# Patient Record
Sex: Female | Born: 1945 | Race: White | Hispanic: No | Marital: Married | Smoking: Former smoker
Health system: Southern US, Community
[De-identification: ages and names within clinical notes are randomized; demographics above are authoritative.]

## PROBLEM LIST (undated history)

## (undated) DIAGNOSIS — I10 Essential (primary) hypertension: Secondary | ICD-10-CM

## (undated) DIAGNOSIS — C439 Malignant melanoma of skin, unspecified: Secondary | ICD-10-CM

## (undated) DIAGNOSIS — E119 Type 2 diabetes mellitus without complications: Secondary | ICD-10-CM

## (undated) HISTORY — DX: Malignant melanoma of skin, unspecified: C43.9

## (undated) HISTORY — PX: OTHER SURGICAL HISTORY: SHX169

## (undated) HISTORY — PX: COLONOSCOPY: SHX174

## (undated) HISTORY — PX: UPPER GASTROINTESTINAL ENDOSCOPY: SHX188

---

## 2001-03-28 ENCOUNTER — Encounter: Admission: RE | Admit: 2001-03-28 | Discharge: 2001-03-28 | Payer: Self-pay | Admitting: *Deleted

## 2001-03-28 ENCOUNTER — Encounter: Payer: Self-pay | Admitting: *Deleted

## 2001-08-13 ENCOUNTER — Ambulatory Visit (HOSPITAL_COMMUNITY): Admission: RE | Admit: 2001-08-13 | Discharge: 2001-08-13 | Payer: Self-pay | Admitting: Gastroenterology

## 2002-09-30 ENCOUNTER — Encounter: Admission: RE | Admit: 2002-09-30 | Discharge: 2002-12-29 | Payer: Self-pay | Admitting: Internal Medicine

## 2002-12-31 ENCOUNTER — Encounter: Admission: RE | Admit: 2002-12-31 | Discharge: 2003-03-31 | Payer: Self-pay | Admitting: Internal Medicine

## 2003-10-06 ENCOUNTER — Other Ambulatory Visit: Admission: RE | Admit: 2003-10-06 | Discharge: 2003-10-06 | Payer: Self-pay | Admitting: Internal Medicine

## 2003-12-02 ENCOUNTER — Emergency Department: Payer: Self-pay | Admitting: Emergency Medicine

## 2004-03-30 ENCOUNTER — Ambulatory Visit: Payer: Self-pay | Admitting: Internal Medicine

## 2004-04-09 ENCOUNTER — Other Ambulatory Visit: Admission: RE | Admit: 2004-04-09 | Discharge: 2004-04-09 | Payer: Self-pay | Admitting: Internal Medicine

## 2005-05-19 ENCOUNTER — Ambulatory Visit: Payer: Self-pay | Admitting: Internal Medicine

## 2006-05-30 ENCOUNTER — Ambulatory Visit: Payer: Self-pay

## 2007-05-31 ENCOUNTER — Other Ambulatory Visit: Admission: RE | Admit: 2007-05-31 | Discharge: 2007-05-31 | Payer: Self-pay | Admitting: Pediatrics

## 2007-07-19 ENCOUNTER — Ambulatory Visit: Payer: Self-pay | Admitting: Family Medicine

## 2007-12-18 ENCOUNTER — Emergency Department: Payer: Self-pay | Admitting: Emergency Medicine

## 2008-09-10 ENCOUNTER — Ambulatory Visit: Payer: Self-pay | Admitting: Family Medicine

## 2009-09-16 ENCOUNTER — Ambulatory Visit: Payer: Self-pay | Admitting: Family Medicine

## 2010-03-18 ENCOUNTER — Inpatient Hospital Stay: Payer: Self-pay | Admitting: *Deleted

## 2010-03-23 ENCOUNTER — Ambulatory Visit: Payer: Self-pay | Admitting: Urology

## 2010-04-08 ENCOUNTER — Ambulatory Visit: Payer: Self-pay | Admitting: Urology

## 2010-04-22 ENCOUNTER — Ambulatory Visit: Payer: Self-pay | Admitting: Urology

## 2010-05-24 ENCOUNTER — Ambulatory Visit: Payer: Self-pay | Admitting: Urology

## 2010-07-02 NOTE — Procedures (Signed)
[facility]  Patient:    Sarah Vaughn, Sarah Vaughn Visit Number: [id number] MRN: [id number]          Service Type: END Location: ENDO Attending Physician:  Dennison Bulla Ii Dictated by:   Verlin Grills, M.D. Proc. Date: 08/13/01 Admit Date:  08/13/2001 Discharge Date: 08/13/2001   CC:         Darius Bump, M.D.   Procedure Report  PROCEDURE:  Colonoscopy.  REFERRING PHYSICIAN:  Darius Bump, M.D.  INDICATION FOR PROCEDURE:  The patient is a 65 year old female born 1945/02/17. Ms. Kittleson oldest sister has undergone colonoscopy to remove noncancerous colon polyps. Her younger sister has also undergone colonoscopy to remove colon polyps. There is no family history of colon cancer.  I discussed with the patient the complications associated with colonoscopy and polypectomy including a 15/1000 risk of bleeding and 05/998 risk of colon perforation requiring surgical repair. The patient has signed the operative permit.  ENDOSCOPIST:  Verlin Grills, M.D.  PREMEDICATION:  Versed 10 mg, Demerol 50 mg.  ENDOSCOPE:  Olympus pediatric colonoscope.  DESCRIPTION OF PROCEDURE:  After obtaining informed consent, the patient was placed in the left lateral decubitus position. I administered intravenous Demerol and intravenous Versed to achieve conscious sedation for the procedure. The patients cardiac rhythm, oxygen saturation and blood pressure were monitored throughout the procedure and documented in the medical record.  Anal inspection was normal. Digital rectal exam was normal. The Olympus pediatric video colonoscope was introduced into the rectum and easily advanced to the cecum. Colonic preparation for the exam today was excellent.  RECTUM:  Normal.  SIGMOID COLON/DESCENDING COLON:  Normal.  SPLENIC FLEXURE:  Normal.  TRANSVERSE COLON:  Normal.  HEPATIC FLEXURE:  Normal.  ASCENDING COLON:  Normal.  CECUM/ILEOCECAL  VALVE:  Normal.  ASSESSMENT:  Normal proctocolonoscopy to the cecum. No endoscopic evidence for the presence of colorectal neoplasia. Dictated by:   Verlin Grills, M.D. Attending Physician:  Dennison Bulla Ii DD:  08/13/01 TD:  08/15/01 Job: [id number]

## 2010-09-08 ENCOUNTER — Other Ambulatory Visit (HOSPITAL_COMMUNITY)
Admission: RE | Admit: 2010-09-08 | Discharge: 2010-09-08 | Disposition: A | Discharge status: Home / Self Care | Payer: BC Managed Care – PPO | Source: Ambulatory Visit | Attending: Family Medicine | Admitting: Family Medicine

## 2010-09-08 ENCOUNTER — Other Ambulatory Visit: Payer: Self-pay | Admitting: Physician Assistant

## 2010-09-08 DIAGNOSIS — Z01419 Encounter for gynecological examination (general) (routine) without abnormal findings: Secondary | ICD-10-CM | POA: Insufficient documentation

## 2010-10-13 ENCOUNTER — Ambulatory Visit: Payer: Self-pay | Admitting: Family Medicine

## 2010-11-23 ENCOUNTER — Ambulatory Visit: Payer: Self-pay | Admitting: Urology

## 2010-12-02 ENCOUNTER — Observation Stay: Payer: Self-pay | Admitting: Internal Medicine

## 2011-03-23 ENCOUNTER — Ambulatory Visit: Payer: Self-pay | Admitting: Gastroenterology

## 2011-03-25 LAB — PATHOLOGY REPORT

## 2011-04-25 ENCOUNTER — Ambulatory Visit: Payer: Self-pay | Admitting: Family Medicine

## 2011-10-14 ENCOUNTER — Ambulatory Visit: Payer: Self-pay | Admitting: Internal Medicine

## 2011-10-25 ENCOUNTER — Ambulatory Visit: Payer: Self-pay | Admitting: Internal Medicine

## 2011-12-23 ENCOUNTER — Ambulatory Visit: Payer: Self-pay | Admitting: Urology

## 2012-10-25 ENCOUNTER — Ambulatory Visit: Payer: Self-pay | Admitting: Internal Medicine

## 2012-11-29 IMAGING — CR DG ABDOMEN 1V
1 series · 2 of 2 positions shown · non-contrast
Comparison: none

REASON FOR EXAM: NEPHROLITHIASIS
COMMENTS:

PROCEDURE:     DXR - DXR KIDNEY URETER BLADDER  - November 23, 2010 [DATE]
RESULT:     Comparison: 04/22/2010

[Series 1: view not recorded · 0.17mm/px · 2 of 2 slices shown]
[im 1/2]
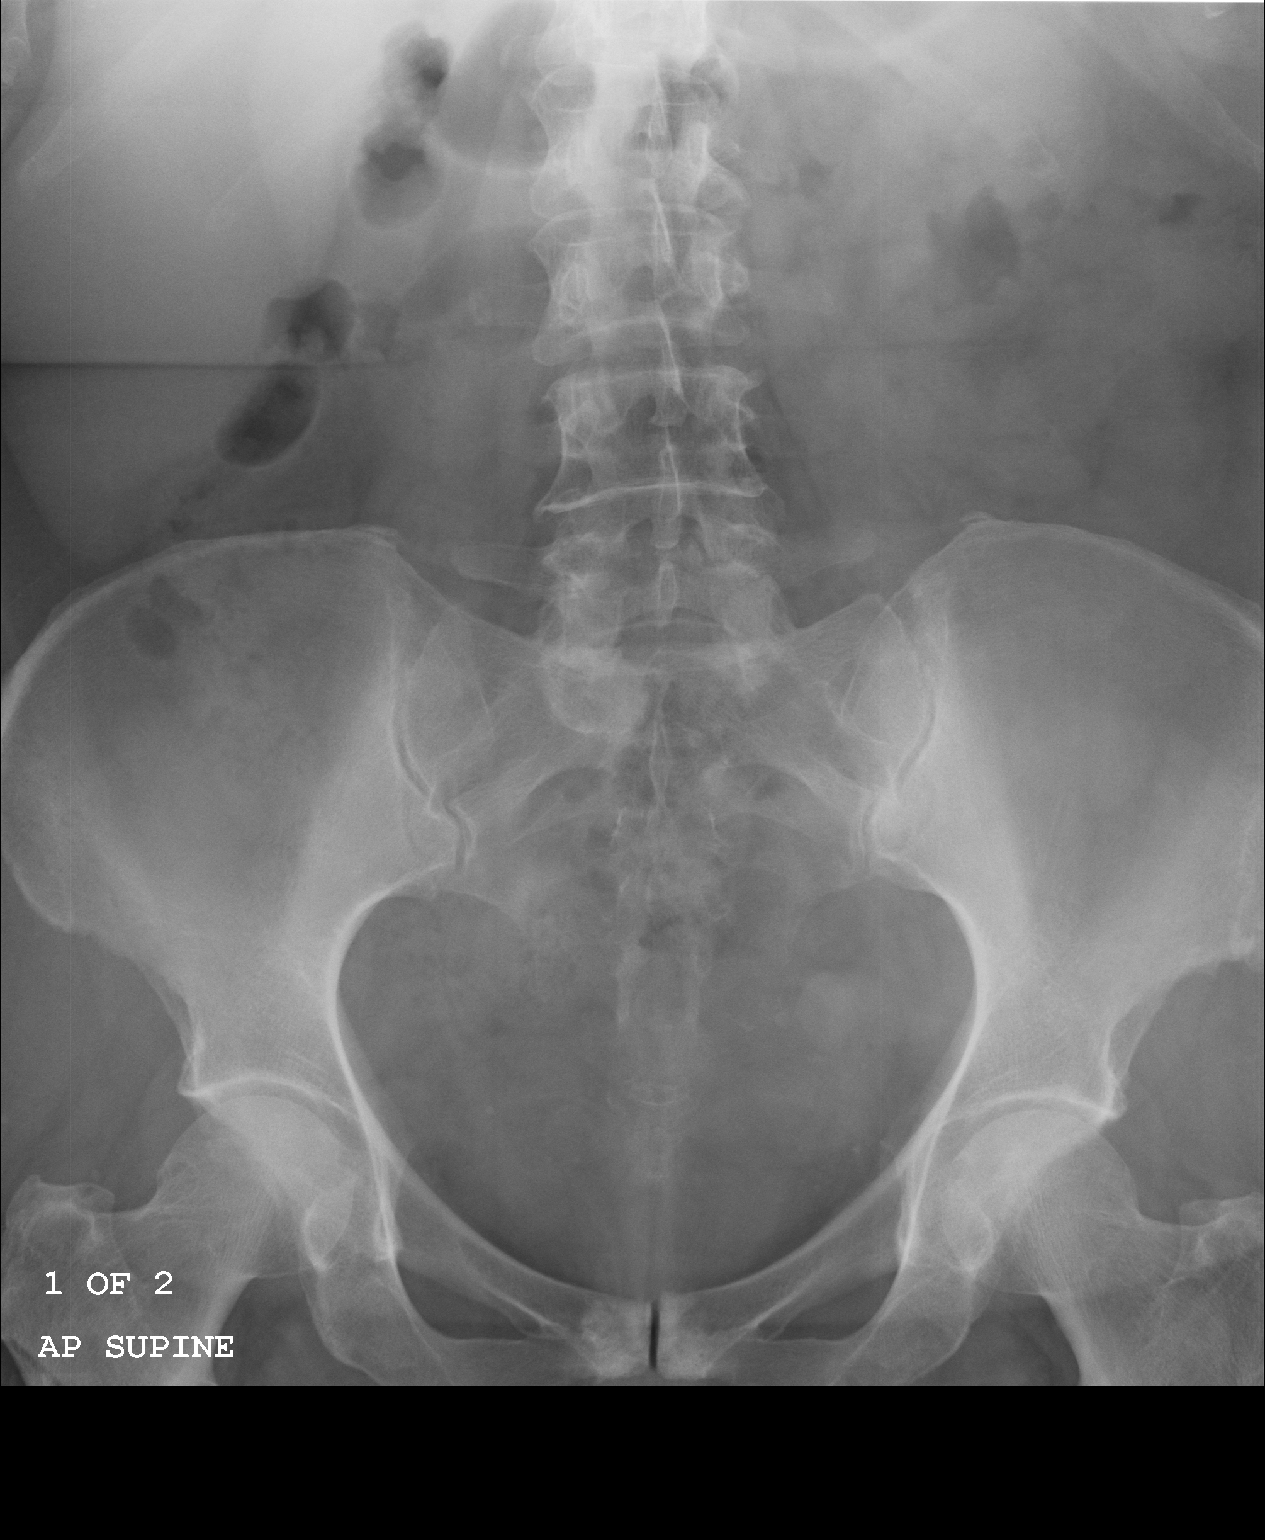
[im 2/2]
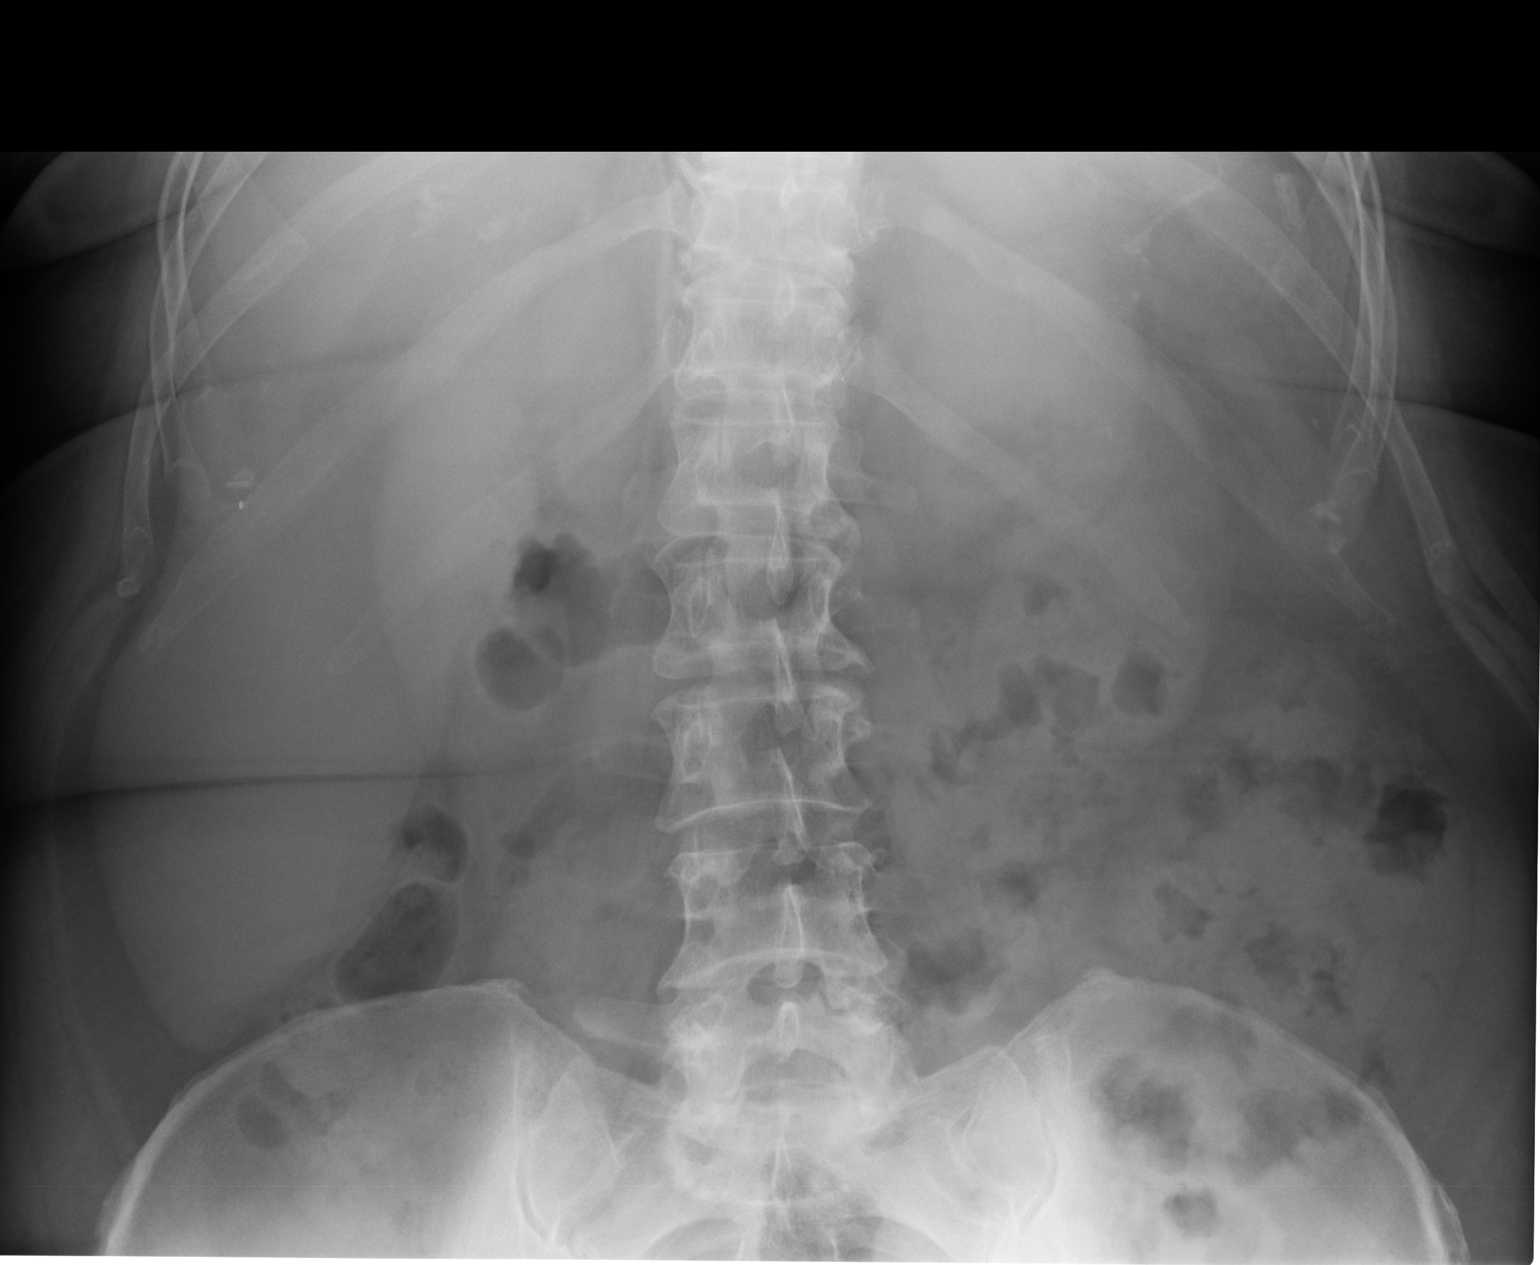

[2 of 2 positions shown; findings below may reference images not displayed]

FINDINGS: The right ureteral stent has been removed. There are small calcific
densities overlying the left renal shadow, which likely represent renal
calculi. The largest measures approximately 4 mm. Calcifications in the
pelvis likely represent phleboliths. Mild degenerative changes are seen the
pubic symphysis.
IMPRESSION: Left-sided nephrolithiasis.

## 2012-12-24 ENCOUNTER — Ambulatory Visit: Payer: Self-pay | Admitting: Urology

## 2013-10-01 ENCOUNTER — Ambulatory Visit: Payer: Self-pay | Admitting: Ophthalmology

## 2013-11-08 ENCOUNTER — Ambulatory Visit: Payer: Self-pay | Admitting: Internal Medicine

## 2014-11-12 ENCOUNTER — Other Ambulatory Visit: Payer: Self-pay | Admitting: Internal Medicine

## 2014-11-12 DIAGNOSIS — Z1231 Encounter for screening mammogram for malignant neoplasm of breast: Secondary | ICD-10-CM

## 2014-11-18 ENCOUNTER — Ambulatory Visit
Admission: RE | Admit: 2014-11-18 | Discharge: 2014-11-18 | Disposition: A | Discharge status: Home / Self Care | Payer: Medicare PPO | Source: Ambulatory Visit | Attending: Internal Medicine | Admitting: Internal Medicine

## 2014-11-18 ENCOUNTER — Other Ambulatory Visit: Payer: Self-pay | Admitting: Internal Medicine

## 2014-11-18 DIAGNOSIS — Z1231 Encounter for screening mammogram for malignant neoplasm of breast: Secondary | ICD-10-CM | POA: Diagnosis present

## 2016-01-04 ENCOUNTER — Other Ambulatory Visit: Payer: Self-pay | Admitting: Internal Medicine

## 2016-01-04 ENCOUNTER — Ambulatory Visit
Admission: RE | Admit: 2016-01-04 | Discharge: 2016-01-04 | Disposition: A | Discharge status: Home / Self Care | Payer: BC Managed Care – PPO | Source: Ambulatory Visit | Attending: Internal Medicine | Admitting: Internal Medicine

## 2016-01-04 DIAGNOSIS — Z1231 Encounter for screening mammogram for malignant neoplasm of breast: Secondary | ICD-10-CM

## 2017-02-09 ENCOUNTER — Other Ambulatory Visit: Payer: Self-pay | Admitting: Internal Medicine

## 2017-02-09 DIAGNOSIS — Z1231 Encounter for screening mammogram for malignant neoplasm of breast: Secondary | ICD-10-CM

## 2017-03-02 ENCOUNTER — Ambulatory Visit
Admission: RE | Admit: 2017-03-02 | Discharge: 2017-03-02 | Disposition: A | Discharge status: Home / Self Care | Payer: Medicare PPO | Source: Ambulatory Visit | Attending: Internal Medicine | Admitting: Internal Medicine

## 2017-03-02 DIAGNOSIS — Z1231 Encounter for screening mammogram for malignant neoplasm of breast: Secondary | ICD-10-CM | POA: Diagnosis not present

## 2017-03-21 ENCOUNTER — Ambulatory Visit: Payer: Medicare PPO | Admitting: Anesthesiology

## 2017-03-21 ENCOUNTER — Ambulatory Visit
Admission: RE | Admit: 2017-03-21 | Discharge: 2017-03-21 | Disposition: A | Discharge status: Home / Self Care | Payer: Medicare PPO | Source: Ambulatory Visit | Attending: Gastroenterology | Admitting: Gastroenterology

## 2017-03-21 ENCOUNTER — Encounter
Admission: RE | Disposition: A | Discharge status: Home / Self Care | Payer: Self-pay | Source: Ambulatory Visit | Attending: Gastroenterology

## 2017-03-21 ENCOUNTER — Encounter: Payer: Self-pay | Admitting: *Deleted

## 2017-03-21 DIAGNOSIS — Z79899 Other long term (current) drug therapy: Secondary | ICD-10-CM | POA: Insufficient documentation

## 2017-03-21 DIAGNOSIS — Z8371 Family history of colonic polyps: Secondary | ICD-10-CM | POA: Diagnosis not present

## 2017-03-21 DIAGNOSIS — K648 Other hemorrhoids: Secondary | ICD-10-CM | POA: Diagnosis not present

## 2017-03-21 DIAGNOSIS — Z7982 Long term (current) use of aspirin: Secondary | ICD-10-CM | POA: Insufficient documentation

## 2017-03-21 DIAGNOSIS — Z87891 Personal history of nicotine dependence: Secondary | ICD-10-CM | POA: Insufficient documentation

## 2017-03-21 DIAGNOSIS — Z7984 Long term (current) use of oral hypoglycemic drugs: Secondary | ICD-10-CM | POA: Insufficient documentation

## 2017-03-21 DIAGNOSIS — I10 Essential (primary) hypertension: Secondary | ICD-10-CM | POA: Insufficient documentation

## 2017-03-21 DIAGNOSIS — D123 Benign neoplasm of transverse colon: Secondary | ICD-10-CM | POA: Diagnosis not present

## 2017-03-21 DIAGNOSIS — E119 Type 2 diabetes mellitus without complications: Secondary | ICD-10-CM | POA: Insufficient documentation

## 2017-03-21 DIAGNOSIS — Z1211 Encounter for screening for malignant neoplasm of colon: Secondary | ICD-10-CM | POA: Insufficient documentation

## 2017-03-21 DIAGNOSIS — K644 Residual hemorrhoidal skin tags: Secondary | ICD-10-CM | POA: Diagnosis not present

## 2017-03-21 HISTORY — DX: Essential (primary) hypertension: I10

## 2017-03-21 HISTORY — DX: Type 2 diabetes mellitus without complications: E11.9

## 2017-03-21 HISTORY — PX: COLONOSCOPY WITH PROPOFOL: SHX5780

## 2017-03-21 LAB — GLUCOSE, CAPILLARY: Glucose-Capillary: 253 mg/dL — ABNORMAL HIGH (ref 65–99)

## 2017-03-21 SURGERY — COLONOSCOPY WITH PROPOFOL
Anesthesia: General

## 2017-03-21 MED ORDER — PROPOFOL 500 MG/50ML IV EMUL
INTRAVENOUS | Status: DC | PRN
  Administered 2017-03-21: 130 ug/kg/min via INTRAVENOUS

## 2017-03-21 MED ORDER — PROPOFOL 10 MG/ML IV BOLUS
INTRAVENOUS | Status: DC | PRN
  Administered 2017-03-21: 90 mg via INTRAVENOUS

## 2017-03-21 MED ORDER — LIDOCAINE HCL (PF) 2 % IJ SOLN
INTRAMUSCULAR | Status: AC
  Filled 2017-03-21: qty 10

## 2017-03-21 MED ORDER — SODIUM CHLORIDE 0.9 % IV SOLN
INTRAVENOUS | Status: DC
  Administered 2017-03-21: 1000 mL via INTRAVENOUS

## 2017-03-21 MED ORDER — LIDOCAINE HCL (CARDIAC) 20 MG/ML IV SOLN
INTRAVENOUS | Status: DC | PRN
  Administered 2017-03-21: 50 mg via INTRAVENOUS

## 2017-03-21 MED ORDER — LIDOCAINE HCL (PF) 1 % IJ SOLN
INTRAMUSCULAR | Status: AC
  Administered 2017-03-21: 0.3 mL via INTRADERMAL
  Filled 2017-03-21: qty 2

## 2017-03-21 MED ORDER — PROPOFOL 10 MG/ML IV BOLUS
INTRAVENOUS | Status: AC
  Filled 2017-03-21: qty 20

## 2017-03-21 MED ORDER — PROPOFOL 500 MG/50ML IV EMUL
INTRAVENOUS | Status: AC
  Filled 2017-03-21: qty 50

## 2017-03-21 MED ORDER — SODIUM CHLORIDE 0.9 % IV SOLN: INTRAVENOUS | Status: DC

## 2017-03-21 MED ORDER — LIDOCAINE HCL (PF) 1 % IJ SOLN
2.0000 mL | Freq: Once | INTRAMUSCULAR | Status: AC
  Administered 2017-03-21: 0.3 mL via INTRADERMAL

## 2017-03-21 NOTE — Op Note (Signed)
[facility] Gastroenterology Patient Name: Sarah Vaughn Procedure Date: 03/21/2017 8:45 AM MRN: [id number] Account #: [id number] Date of Birth: [birth year] Admit Type: Outpatient Age: 72 Room: [facility] ENDO ROOM 3 Gender: Female Note Status: Finalized Procedure:            Colonoscopy Indications:          Family history of colonic polyps in a first-degree                        relative Providers:            Lollie Sails, MD Referring MD:         Rusty Aus, MD (Referring MD) Medicines:            Monitored Anesthesia Care Complications:        No immediate complications. Procedure:            Pre-Anesthesia Assessment:                       - ASA Grade Assessment: II - A patient with mild                        systemic disease.                       After obtaining informed consent, the colonoscope was                        passed under direct vision. Throughout the procedure,                        the patient's blood pressure, pulse, and oxygen                        saturations were monitored continuously. The                        Colonoscope was introduced through the anus and                        advanced to the the cecum, identified by appendiceal                        orifice and ileocecal valve. The colonoscopy was                        performed with moderate difficulty due to a tortuous                        colon. Successful completion of the procedure was aided                        by changing endoscopes. The patient tolerated the                        procedure well. The quality of the bowel preparation                        was good. Findings:      A 5 mm polyp was found in the transverse colon. The polyp was sessile.  The polyp was removed with a cold snare. Resection and retrieval were       complete.      Biopsies for histology were taken with a cold forceps from the right       colon and left colon for evaluation of  microscopic colitis.      Non-bleeding external and internal hemorrhoids were found during       retroflexion and during anoscopy. The hemorrhoids were small.      The digital rectal exam was normal.      The retroflexed view of the distal rectum and anal verge was normal and       showed no anal or rectal abnormalities. Impression:           - One 5 mm polyp in the transverse colon, removed with                        a cold snare. Resected and retrieved.                       - Non-bleeding external and internal hemorrhoids.                       - Biopsies were taken with a cold forceps from the                        right colon and left colon for evaluation of                        microscopic colitis. Recommendation:       - Discharge patient to home.                       - Await pathology results.                       - Telephone GI clinic for pathology results in 1 week. Procedure Code(s):    --- Professional ---                       [phone], Colonoscopy, flexible; with removal of tumor(s),                        polyp(s), or other lesion(s) by snare technique                       45380, 44, Colonoscopy, flexible; with biopsy, single                        or multiple Diagnosis Code(s):    --- Professional ---                       D12.3, Benign neoplasm of transverse colon (hepatic                        flexure or splenic flexure)                       K64.8, Other hemorrhoids                       Z83.71, Family history of colonic polyps CPT copyright 2016 American Medical  Association. All rights reserved. The codes documented in this report are preliminary and upon coder review may  be revised to meet current compliance requirements. Lollie Sails, MD 03/21/2017 9:31:53 AM This report has been signed electronically. Number of Addenda: 0 Note Initiated On: 03/21/2017 8:45 AM Scope Withdrawal Time: 0 hours 13 minutes 32 seconds  Total Procedure Duration: 0 hours 33  minutes 57 seconds       [facility]

## 2017-03-21 NOTE — Anesthesia Postprocedure Evaluation (Signed)
Anesthesia Post Note  Patient: Sarah Vaughn  Procedure(s) Performed: COLONOSCOPY WITH PROPOFOL (N/A )  Patient location during evaluation: Endoscopy Anesthesia Type: General Level of consciousness: awake and alert and oriented Pain management: pain level controlled Vital Signs Assessment: post-procedure vital signs reviewed and stable Respiratory status: spontaneous breathing, nonlabored ventilation and respiratory function stable Cardiovascular status: blood pressure returned to baseline and stable Postop Assessment: no signs of nausea or vomiting Anesthetic complications: no     Last Vitals:  Vitals:   03/21/17 0950 03/21/17 1000  BP: 119/76 130/65  Pulse: 78 74  Resp: 17 (!) 21  Temp:    SpO2: 97% 100%    Last Pain:  Vitals:   03/21/17 0920  TempSrc: Tympanic                 Lilja Soland

## 2017-03-21 NOTE — Transfer of Care (Signed)
Immediate Anesthesia Transfer of Care Note  Patient: Sarah Vaughn  Procedure(s) Performed: COLONOSCOPY WITH PROPOFOL (N/A )  Patient Location: PACU and Endoscopy Unit  Anesthesia Type:General  Level of Consciousness: drowsy  Airway & Oxygen Therapy: Patient Spontanous Breathing and Patient connected to nasal cannula oxygen  Post-op Assessment: Report given to RN and Post -op Vital signs reviewed and stable  Post vital signs: Reviewed and stable  Last Vitals:  Vitals:   03/21/17 0745 03/21/17 0920  BP: 140/83   Pulse: 96 (P) 87  Resp: 17 (P) 19  Temp: (!) 35.8 C (!) (P) 36.2 C  SpO2: 98% (P) 97%    Last Pain:  Vitals:   03/21/17 0920  TempSrc: (P) Tympanic         Complications: No apparent anesthesia complications

## 2017-03-21 NOTE — Anesthesia Post-op Follow-up Note (Signed)
Anesthesia QCDR form completed.        

## 2017-03-21 NOTE — Anesthesia Preprocedure Evaluation (Signed)
Anesthesia Evaluation  Patient identified by MRN, date of birth, ID band Patient awake    Reviewed: Allergy & Precautions, NPO status , Patient's Chart, lab work & pertinent test results  History of Anesthesia Complications Negative for: history of anesthetic complications  Airway Mallampati: IV  TM Distance: >3 FB Neck ROM: Full    Dental  (+) Implants   Pulmonary neg sleep apnea, neg COPD, former smoker,    breath sounds clear to auscultation- rhonchi (-) wheezing      Cardiovascular Exercise Tolerance: Good hypertension, Pt. on medications (-) CAD, (-) Past MI, (-) Cardiac Stents and (-) CABG  Rhythm:Regular Rate:Normal - Systolic murmurs and - Diastolic murmurs    Neuro/Psych negative neurological ROS  negative psych ROS   GI/Hepatic negative GI ROS, Neg liver ROS,   Endo/Other  diabetes, Oral Hypoglycemic Agents  Renal/GU Renal disease: hx of nephrolithiasis.     Musculoskeletal negative musculoskeletal ROS (+)   Abdominal (+) + obese,   Peds  Hematology negative hematology ROS (+)   Anesthesia Other Findings Past Medical History: No date: Diabetes mellitus without complication (HCC) No date: Hypertension   Reproductive/Obstetrics                             Anesthesia Physical Anesthesia Plan  ASA: II  Anesthesia Plan: General   Post-op Pain Management:    Induction: Intravenous  PONV Risk Score and Plan: 2 and Propofol infusion  Airway Management Planned: Natural Airway  Additional Equipment:   Intra-op Plan:   Post-operative Plan:   Informed Consent: I have reviewed the patients History and Physical, chart, labs and discussed the procedure including the risks, benefits and alternatives for the proposed anesthesia with the patient or authorized representative who has indicated his/her understanding and acceptance.   Dental advisory given  Plan Discussed with:  CRNA and Anesthesiologist  Anesthesia Plan Comments:         Anesthesia Quick Evaluation

## 2017-03-21 NOTE — H&P (Signed)
Outpatient short stay form Pre-procedure 03/21/2017 8:42 AM Sarah Sails MD  Primary Physician: Dr. Emily Filbert  Reason for visit: Colonoscopy  History of present illness: Patient is a 58 female presenting today as above.  There is a family history of colon polyps and several primary relatives but she herself is never had polyps.  She tolerated her prep well.  She takes no blood thinning agents.  She takes no aspirin products with the exception of 81 mg aspirin that has been held for a day.    Current Facility-Administered Medications:  .  0.9 %  sodium chloride infusion, , Intravenous, Continuous, Sarah Sails, MD, Last Rate: 20 mL/hr at 03/21/17 0806, 1,000 mL at 03/21/17 0806 .  0.9 %  sodium chloride infusion, , Intravenous, Continuous, Sarah Sails, MD  Medications Prior to Admission  Medication Sig Dispense Refill Last Dose  . aspirin EC 81 MG tablet Take 81 mg by mouth daily.   03/19/2017  . calcium-vitamin D (OSCAL WITH D) 500-200 MG-UNIT tablet Take 1 tablet by mouth 2 (two) times daily.     Marland Kitchen Co-Enzyme Q10 100 MG CAPS Take by mouth daily.     Marland Kitchen glimepiride (AMARYL) 2 MG tablet Take 2 mg by mouth daily with breakfast.     . lisinopril (PRINIVIL,ZESTRIL) 20 MG tablet Take 20 mg by mouth daily.   03/21/2017 at 0530  . Magnesium 250 MG TABS Take by mouth daily.     . metFORMIN (GLUCOPHAGE) 500 MG tablet Take 1,000 mg by mouth 2 (two) times daily with a meal.     . Omeprazole 20 MG TBEC Take by mouth daily.     . Phentermine HCl (ADIPEX-P PO) Take 15 mg by mouth daily.        Allergies  Allergen Reactions  . Lactulose   . Mucinex [Guaifenesin Er]      Past Medical History:  Diagnosis Date  . Diabetes mellitus without complication ([place])   . Hypertension   Review of systems:      Physical Exam    Heart and lungs: Regular rate and rhythm without rub or gallop, lungs are bilaterally clear.    HEENT: Normocephalic atraumatic eyes are anicteric    Other:     Pertinant exam for procedure: Soft nontender nondistended bowel sounds positive and normoactive    Planned proceedures: Colonoscopy and indicated procedures I have discussed the risks benefits and complications of procedures to include not limited to bleeding, infection, perforation and the risk of sedation and the patient wishes to proceed.    Sarah Sails, MD Gastroenterology 03/21/2017  8:42 AM

## 2017-03-23 LAB — SURGICAL PATHOLOGY

## 2018-03-06 ENCOUNTER — Other Ambulatory Visit: Payer: Self-pay | Admitting: Internal Medicine

## 2018-03-06 DIAGNOSIS — Z1231 Encounter for screening mammogram for malignant neoplasm of breast: Secondary | ICD-10-CM

## 2018-03-23 ENCOUNTER — Ambulatory Visit
Admission: RE | Admit: 2018-03-23 | Discharge: 2018-03-23 | Disposition: A | Discharge status: Home / Self Care | Payer: 59 | Source: Ambulatory Visit | Attending: Internal Medicine | Admitting: Internal Medicine

## 2018-03-23 DIAGNOSIS — Z1231 Encounter for screening mammogram for malignant neoplasm of breast: Secondary | ICD-10-CM | POA: Insufficient documentation

## 2019-04-10 ENCOUNTER — Other Ambulatory Visit: Payer: Self-pay | Admitting: Internal Medicine

## 2019-04-10 DIAGNOSIS — Z1231 Encounter for screening mammogram for malignant neoplasm of breast: Secondary | ICD-10-CM

## 2019-05-03 ENCOUNTER — Ambulatory Visit
Admission: RE | Admit: 2019-05-03 | Discharge: 2019-05-03 | Disposition: A | Discharge status: Home / Self Care | Payer: Medicare Other | Source: Ambulatory Visit | Attending: Internal Medicine | Admitting: Internal Medicine

## 2019-05-03 DIAGNOSIS — Z1231 Encounter for screening mammogram for malignant neoplasm of breast: Secondary | ICD-10-CM

## 2020-02-04 ENCOUNTER — Ambulatory Visit (INDEPENDENT_AMBULATORY_CARE_PROVIDER_SITE_OTHER): Payer: BC Managed Care – PPO | Admitting: Dermatology

## 2020-02-04 ENCOUNTER — Other Ambulatory Visit: Payer: Self-pay

## 2020-02-04 DIAGNOSIS — L853 Xerosis cutis: Secondary | ICD-10-CM | POA: Diagnosis not present

## 2020-02-04 DIAGNOSIS — Z1283 Encounter for screening for malignant neoplasm of skin: Secondary | ICD-10-CM

## 2020-02-04 DIAGNOSIS — L905 Scar conditions and fibrosis of skin: Secondary | ICD-10-CM

## 2020-02-04 DIAGNOSIS — L578 Other skin changes due to chronic exposure to nonionizing radiation: Secondary | ICD-10-CM

## 2020-02-04 DIAGNOSIS — Z86006 Personal history of melanoma in-situ: Secondary | ICD-10-CM

## 2020-02-04 DIAGNOSIS — L650 Telogen effluvium: Secondary | ICD-10-CM

## 2020-02-04 DIAGNOSIS — L814 Other melanin hyperpigmentation: Secondary | ICD-10-CM

## 2020-02-04 DIAGNOSIS — D229 Melanocytic nevi, unspecified: Secondary | ICD-10-CM

## 2020-02-04 DIAGNOSIS — D1800 Hemangioma unspecified site: Secondary | ICD-10-CM

## 2020-02-04 DIAGNOSIS — L821 Other seborrheic keratosis: Secondary | ICD-10-CM

## 2020-02-04 NOTE — Progress Notes (Signed)
   Follow-Up Visit   Subjective  Sarah Vaughn is a 74 y.o. female who presents for the following: Annual Exam (Patient here today for TBSE. 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 Nothing new or changing patient is aware of. ).  Patient had Covid September 2021 and has noticed hair loss since being sick.   The following portions of the chart were reviewed this encounter and updated as appropriate:       Review of Systems:  No other skin or systemic complaints except as noted in HPI or Assessment and Plan.  Objective  Well appearing patient in no apparent distress; mood and affect are within normal limits.  A full examination was performed including scalp, head, eyes, ears, nose, lips, neck, chest, axillae, abdomen, back, buttocks, bilateral upper extremities, bilateral lower extremities, hands, feet, fingers, toes, fingernails, and toenails. All findings within normal limits unless otherwise noted below.  Objective  Spinal upper Back: Linear scar   Objective  Scalp: Diffuse thinning of hair, especially at crown and temples   Assessment & Plan  Scar Spinal upper Back  History of excision of fatty growth per patient years ago  Telogen effluvium Scalp  Due to past Covid infection. Discussed hair will thin, but will regrow.   Xerosis - diffuse xerotic patches - recommend gentle, hydrating skin care - gentle skin care handout given  Lentigines - Scattered tan macules - Discussed due to sun exposure - Benign, observe - Call for any changes  Seborrheic Keratoses - Stuck-on, waxy, tan-brown papules and plaques  - Discussed benign etiology and prognosis. - Observe - Call for any changes  Melanocytic Nevi - Tan-brown and/or pink-flesh-colored symmetric macules and papules right mid back - Benign appearing on exam today - Observation - Call clinic for new or changing moles - Recommend daily use of broad spectrum spf 30+  sunscreen to sun-exposed areas.   Hemangiomas - Red papules - Discussed benign nature - Observe - Call for any changes  Actinic Damage - Chronic, secondary to cumulative UV/sun exposure - diffuse scaly erythematous macules with underlying dyspigmentation - Recommend daily broad spectrum sunscreen SPF 30+ to sun-exposed areas, reapply every 2 hours as needed.  - Call for new or changing lesions.  Skin cancer screening performed today.  History of Lentigo Maligna - No evidence of recurrence today at left lower leg - Recommend regular full body skin exams - Recommend daily broad spectrum sunscreen SPF 30+ to sun-exposed areas, reapply every 2 hours as needed.  - Call if any new or changing lesions are noted between office visits   Return in about 1 year (around 02/03/2021) for TBSE.  Graciella Belton, RMA, am acting as scribe for Brendolyn Patty, MD . Documentation: I have reviewed the above documentation for accuracy and completeness, and I agree with the above.  Brendolyn Patty MD

## 2020-02-04 NOTE — Patient Instructions (Addendum)
Melanoma ABCDEs  Melanoma is the most dangerous type of skin cancer, and is the leading cause of death from skin disease.  You are more likely to develop melanoma if you:  Have light-colored skin, light-colored eyes, or red or blond hair  Spend a lot of time in the sun  Tan regularly, either outdoors or in a tanning bed  Have had blistering sunburns, especially during childhood  Have a close family member who has had a melanoma  Have atypical moles or large birthmarks  Early detection of melanoma is key since treatment is typically straightforward and cure rates are extremely high if we catch it early.   The first sign of melanoma is often a change in a mole or a new dark spot.  The ABCDE system is a way of remembering the signs of melanoma.  A for asymmetry:  The two halves do not match. B for border:  The edges of the growth are irregular. C for color:  A mixture of colors are present instead of an even brown color. D for diameter:  Melanomas are usually (but not always) greater than 61mm - the size of a pencil eraser. E for evolution:  The spot keeps changing in size, shape, and color.  Please check your skin once per month between visits. You can use a small mirror in front and a large mirror behind you to keep an eye on the back side or your body.   If you see any new or changing lesions before your next follow-up, please call to schedule a visit.  Please continue daily skin protection including broad spectrum sunscreen SPF 30+ to sun-exposed areas, reapplying every 2 hours as needed when you're outdoors.   Seborrheic Keratosis  What causes seborrheic keratoses? Seborrheic keratoses are harmless, common skin growths that first appear during adult life.  As time goes by, more growths appear.  Some people may develop a large number of them.  Seborrheic keratoses appear on both covered and uncovered body parts.  They are not caused by sunlight.  The tendency to develop seborrheic  keratoses can be inherited.  They vary in color from skin-colored to gray, brown, or even black.  They can be either smooth or have a rough, warty surface.   Seborrheic keratoses are superficial and look as if they were stuck on the skin.  Under the microscope this type of keratosis looks like layers upon layers of skin.  That is why at times the top layer may seem to fall off, but the rest of the growth remains and re-grows.    Treatment Seborrheic keratoses do not need to be treated, but can easily be removed in the office.  Seborrheic keratoses often cause symptoms when they rub on clothing or jewelry.  Lesions can be in the way of shaving.  If they become inflamed, they can cause itching, soreness, or burning.  Removal of a seborrheic keratosis can be accomplished by freezing, burning, or surgery. If any spot bleeds, scabs, or grows rapidly, please return to have it checked, as these can be an indication of a skin cancer.  Gentle Skin Care Guide  1. Bathe no more than once a day.  2. Avoid bathing in hot water  3. Use a mild soap like Dove, Vanicream, Cetaphil, CeraVe. Can use Lever 2000 or Cetaphil antibacterial soap  4. Use soap only where you need it. On most days, use it under your arms, between your legs, and on your feet. Let the water  rinse other areas unless visibly dirty.  5. When you get out of the bath/shower, use a towel to gently blot your skin dry, don't rub it.  6. While your skin is still a little damp, apply a moisturizing cream such as Vanicream, CeraVe, Cetaphil, Eucerin, Sarna lotion or plain Vaseline Jelly. For hands apply Neutrogena [place] Hand Cream or Excipial Hand Cream.  7. Reapply moisturizer any time you start to itch or feel dry.  8. Sometimes using free and clear laundry detergents can be helpful. Fabric softener sheets should be avoided. Downy Free & Gentle liquid, or any liquid fabric softener that is free of dyes and perfumes, it acceptable to use  9.  If your doctor has given you prescription creams you may apply moisturizers over them

## 2020-06-17 ENCOUNTER — Other Ambulatory Visit: Payer: Self-pay | Admitting: Internal Medicine

## 2020-06-17 DIAGNOSIS — Z1231 Encounter for screening mammogram for malignant neoplasm of breast: Secondary | ICD-10-CM

## 2020-06-22 ENCOUNTER — Ambulatory Visit
Admission: RE | Admit: 2020-06-22 | Discharge: 2020-06-22 | Disposition: A | Discharge status: Home / Self Care | Payer: Medicare Other | Source: Ambulatory Visit | Attending: Internal Medicine | Admitting: Internal Medicine

## 2020-06-22 ENCOUNTER — Other Ambulatory Visit: Payer: Self-pay

## 2020-06-22 DIAGNOSIS — Z1231 Encounter for screening mammogram for malignant neoplasm of breast: Secondary | ICD-10-CM | POA: Insufficient documentation

## 2020-09-21 ENCOUNTER — Other Ambulatory Visit: Payer: Self-pay | Admitting: Surgery

## 2020-09-21 DIAGNOSIS — M7581 Other shoulder lesions, right shoulder: Secondary | ICD-10-CM

## 2020-10-01 ENCOUNTER — Other Ambulatory Visit: Payer: Self-pay

## 2020-10-01 ENCOUNTER — Ambulatory Visit
Admission: RE | Admit: 2020-10-01 | Discharge: 2020-10-01 | Disposition: A | Discharge status: Home / Self Care | Payer: Medicare Other | Source: Ambulatory Visit | Attending: Surgery | Admitting: Surgery

## 2020-10-01 DIAGNOSIS — M7581 Other shoulder lesions, right shoulder: Secondary | ICD-10-CM | POA: Diagnosis not present

## 2021-02-16 ENCOUNTER — Encounter: Payer: BC Managed Care – PPO | Admitting: Dermatology

## 2021-03-09 ENCOUNTER — Ambulatory Visit (INDEPENDENT_AMBULATORY_CARE_PROVIDER_SITE_OTHER): Payer: Medicare Other | Admitting: Dermatology

## 2021-03-09 ENCOUNTER — Encounter: Payer: Self-pay | Admitting: Dermatology

## 2021-03-09 ENCOUNTER — Other Ambulatory Visit: Payer: Self-pay

## 2021-03-09 DIAGNOSIS — Z872 Personal history of diseases of the skin and subcutaneous tissue: Secondary | ICD-10-CM

## 2021-03-09 DIAGNOSIS — L309 Dermatitis, unspecified: Secondary | ICD-10-CM

## 2021-03-09 DIAGNOSIS — L814 Other melanin hyperpigmentation: Secondary | ICD-10-CM

## 2021-03-09 DIAGNOSIS — L820 Inflamed seborrheic keratosis: Secondary | ICD-10-CM

## 2021-03-09 DIAGNOSIS — Z1283 Encounter for screening for malignant neoplasm of skin: Secondary | ICD-10-CM

## 2021-03-09 DIAGNOSIS — L918 Other hypertrophic disorders of the skin: Secondary | ICD-10-CM

## 2021-03-09 DIAGNOSIS — L578 Other skin changes due to chronic exposure to nonionizing radiation: Secondary | ICD-10-CM

## 2021-03-09 DIAGNOSIS — L300 Nummular dermatitis: Secondary | ICD-10-CM

## 2021-03-09 DIAGNOSIS — L821 Other seborrheic keratosis: Secondary | ICD-10-CM

## 2021-03-09 DIAGNOSIS — D229 Melanocytic nevi, unspecified: Secondary | ICD-10-CM

## 2021-03-09 DIAGNOSIS — D1800 Hemangioma unspecified site: Secondary | ICD-10-CM

## 2021-03-09 MED ORDER — MOMETASONE FUROATE 0.1 % EX CREA: TOPICAL_CREAM | CUTANEOUS | 2 refills | Status: DC

## 2021-03-09 NOTE — Progress Notes (Signed)
Follow-Up Visit   Subjective  Sarah Vaughn is a 76 y.o. female who presents for the following: Annual Exam.  The patient presents for Total-Body Skin Exam (TBSE) for skin cancer screening and mole check.  The patient has spots, moles and lesions to be evaluated, some may be new or changing. She has an itchy spot on her right side. 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  The following portions of the chart were reviewed this encounter and updated as appropriate:       Review of Systems:  No other skin or systemic complaints except as noted in HPI or Assessment and Plan.  Objective  Well appearing patient in no apparent distress; mood and affect are within normal limits.  A full examination was performed including scalp, head, eyes, ears, nose, lips, neck, chest, axillae, abdomen, back, buttocks, bilateral upper extremities, bilateral lower extremities, hands, feet, fingers, toes, fingernails, and toenails. All findings within normal limits unless otherwise noted below.  hands Pink scaly patches on the R dorsal hand and L dorsal 5th finger  lower legs Pink scaly patches of the ankles and calf.  Right Flank x 3 (3) Erythematous stuck-on, waxy papules    Assessment & Plan  Skin cancer screening performed today.  Actinic Damage - chronic, secondary to cumulative UV radiation exposure/sun exposure over time - diffuse scaly erythematous macules with underlying dyspigmentation - Recommend daily broad spectrum sunscreen SPF 30+ to sun-exposed areas, reapply every 2 hours as needed.  - Recommend staying in the shade or wearing long sleeves, sun glasses (UVA+UVB protection) and wide brim hats (4-inch brim around the entire circumference of the hat). - Call for new or changing lesions.  History of Lentigo Maligna - No evidence of recurrence today of left post lower leg - Recommend regular full body skin exams - Recommend daily broad spectrum  sunscreen SPF 30+ to sun-exposed areas, reapply every 2 hours as needed.  - Call if any new or changing lesions are noted between office visits  Lentigines - Scattered tan macules - Due to sun exposure - Benign-appering, observe - Recommend daily broad spectrum sunscreen SPF 30+ to sun-exposed areas, reapply every 2 hours as needed. - Call for any changes  Hemangiomas - Red papules - Discussed benign nature - Observe - Call for any changes  Melanocytic Nevi - Tan-brown and/or pink-flesh-colored symmetric macules and papules - Benign appearing on exam today - Observation - Call clinic for new or changing moles - Recommend daily use of broad spectrum spf 30+ sunscreen to sun-exposed areas.   Seborrheic Keratoses - Stuck-on, waxy, tan-brown papules and/or plaques  - Benign-appearing - Discussed benign etiology and prognosis. - Observe - Call for any changes  Hand dermatitis hands  Start mometasone cream Apply to AA hands qd/bid until rash improved dsp 45g 2Rf. Avoid face, groin, axilla.  Topical steroids (such as triamcinolone, fluocinolone, fluocinonide, mometasone, clobetasol, halobetasol, betamethasone, hydrocortisone) can cause thinning and lightening of the skin if they are used for too long in the same area. Your physician has selected the right strength medicine for your problem and area affected on the body. Please use your medication only as directed by your physician to prevent side effects.   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  While there is no cure, the rash and symptoms can be managed with topical prescription medications, and for more severe cases, with  systemic medications.  Recommend mild soap and routine use of moisturizing cream after handwashing.  Minimize soap/water exposure when possible.    mometasone (ELOCON) 0.1 % cream - hands Apply to affected areas hands, legs once to twice daily until rash  improved. Avoid face, groin, underarms.  Nummular dermatitis lower legs  Start mometasone cream Apply qd/bid until rash improved.   Recommend mild soap and moisturizing cream 1-2 times daily.  Gentle skin care handout provided.   Topical steroids (such as triamcinolone, fluocinolone, fluocinonide, mometasone, clobetasol, halobetasol, betamethasone, hydrocortisone) can cause thinning and lightening of the skin if they are used for too long in the same area. Your physician has selected the right strength medicine for your problem and area affected on the body. Please use your medication only as directed by your physician to prevent side effects.    Inflamed seborrheic keratosis (3) Right Flank x 3  Destruction of lesion - Right Flank x 3  Destruction method: cryotherapy   Informed consent: discussed and consent obtained   Lesion destroyed using liquid nitrogen: Yes   Region frozen until ice ball extended beyond lesion: Yes   Outcome: patient tolerated procedure well with no complications   Post-procedure details: wound care instructions given   Additional details:  Prior to procedure, discussed risks of blister formation, small wound, skin dyspigmentation, or rare scar following cryotherapy. Recommend Vaseline ointment to treated areas while healing.    Acrochordons (Skin Tags) - Fleshy, skin-colored pedunculated papules - Benign appearing.  - Observe. - If desired, they can be removed with an in office procedure that is not covered by insurance. - Please call the clinic if you notice any new or changing lesions.  Return in about 1 year (around 03/09/2022) for TBSE.  IJamesetta Orleans, CMA, am acting as scribe for Brendolyn Patty, MD . Documentation: I have reviewed the above documentation for accuracy and completeness, and I agree with the above.  Brendolyn Patty MD

## 2021-03-09 NOTE — Patient Instructions (Addendum)
Dry Skin Care  What causes dry skin?  Dry skin is common and results from inadequate moisture in the outer skin layers. Dry skin usually results from the excessive loss of moisture from the skin surface. This occurs due to two major factors: Normally the skin's oil glands deposit a layer of oil on the skin's surface. This layer of oil prevents the loss of moisture from the skin. Exposure to soaps, cleaners, solvents, and disinfectants removes this oily film, allowing water to escape. Water loss from the skin increases when the humidity is low. During winter months we spend a lot of time indoors where the air is heated. Heated air has very low humidity. This also contributes to dry skin.  A tendency for dry skin may accompany such disorders as eczema. Also, as people age, the number of functioning oil glands decreases, and the tendency toward dry skin can be a sensation of skin tightness when emerging from the shower.  How do I manage dry skin?  Humidify your environment. This can be accomplished by using a humidifier in your bedroom at night during winter months. Bathing can actually put moisture back into your skin if done right. Take the following steps while bathing to sooth dry skin: Avoid hot water, which only dries the skin and makes itching worse. Use warm water. Avoid washcloths or extensive rubbing or scrubbing. Use mild soaps like unscented Dove, Oil of Olay, Cetaphil, Basis, or CeraVe. If you take baths rather than showers, rinse off soap residue with clean water before getting out of tub. Once out of the shower/tub, pat dry gently with a soft towel. Leave your skin damp. While still damp, apply any medicated ointment/cream you were prescribed to the affected areas. After you apply your medicated ointment/cream, then apply your moisturizer to your whole body.This is the most important step in dry skin care. If this is omitted, your skin will continue to be dry. The choice of  moisturizer is also very important. In general, lotion will not provider enough moisture to severely dry skin because it is water based. You should use an ointment or cream. Moisturizers should also be unscented. Good choices include Vaseline (plain petrolatum), Aquaphor, Cetaphil, CeraVe, Vanicream, DML Forte, Aveeno moisture, or Eucerin Cream. Bath oils can be helpful, but do not replace the application of moisturizer after the bath. In addition, they make the tub slippery causing an increased risk for falls. Therefore, we do not recommend their use.   Cryotherapy Aftercare  Wash gently with soap and water everyday.   Apply Vaseline and Band-Aid daily until healed.    Seborrheic Keratosis  What causes seborrheic keratoses? Seborrheic keratoses are harmless, common skin growths that first appear during adult life.  As time goes by, more growths appear.  Some people may develop a large number of them.  Seborrheic keratoses appear on both covered and uncovered body parts.  They are not caused by sunlight.  The tendency to develop seborrheic keratoses can be inherited.  They vary in color from skin-colored to gray, brown, or even black.  They can be either smooth or have a rough, warty surface.   Seborrheic keratoses are superficial and look as if they were stuck on the skin.  Under the microscope this type of keratosis looks like layers upon layers of skin.  That is why at times the top layer may seem to fall off, but the rest of the growth remains and re-grows.    Treatment Seborrheic keratoses do not need  to be treated, but can easily be removed in the office.  Seborrheic keratoses often cause symptoms when they rub on clothing or jewelry.  Lesions can be in the way of shaving.  If they become inflamed, they can cause itching, soreness, or burning.  Removal of a seborrheic keratosis can be accomplished by freezing, burning, or surgery. If any spot bleeds, scabs, or grows rapidly, please return to  have it checked, as these can be an indication of a skin cancer.  If You Need Anything After Your Visit  If you have any questions or concerns for your doctor, please call our main line at [phone] and press option 4 to reach your doctor's medical assistant. If no one answers, please leave a voicemail as directed and we will return your call as soon as possible. Messages left after 4 pm will be answered the following business day.   You may also send [place] a message via [place]. We typically respond to MyChart messages within 1-2 business days.  For prescription refills, please ask your pharmacy to contact our office. Our fax number is [phone].  If you have an urgent issue when the clinic is closed that cannot wait until the next business day, you can page your doctor at the number below.    Please note that while we do our best to be available for urgent issues outside of office hours, we are not available 24/7.   If you have an urgent issue and are unable to reach [place], you may choose to seek medical care at your doctor's office, retail clinic, urgent care center, or emergency room.  If you have a medical emergency, please immediately call 911 or go to the emergency department.  Pager Numbers  - Dr. Nehemiah Massed: [phone]  - Dr. Laurence Ferrari: [phone]  - Dr. Nicole Kindred: [phone]  In the event of inclement weather, please call our main line at [phone] for an update on the status of any delays or closures.  Dermatology Medication Tips: Please keep the boxes that topical medications come in in order to help keep track of the instructions about where and how to use these. Pharmacies typically print the medication instructions only on the boxes and not directly on the medication tubes.   If your medication is too expensive, please contact our office at [phone] option 4 or send [place] a message through [place].   We are unable to tell what your co-pay for medications will be in  advance as this is different depending on your insurance coverage. However, we may be able to find a substitute medication at lower cost or fill out paperwork to get insurance to cover a needed medication.   If a prior authorization is required to get your medication covered by your insurance company, please allow [place] 1-2 business days to complete this process.  Drug prices often vary depending on where the prescription is filled and some pharmacies may offer cheaper prices.  The website www.goodrx.com contains coupons for medications through different pharmacies. The prices here do not account for what the cost may be with help from insurance (it may be cheaper with your insurance), but the website can give you the price if you did not use any insurance.  - You can print the associated coupon and take it with your prescription to the pharmacy.  - You may also stop by our office during regular business hours and pick up a GoodRx coupon card.  - If you need your prescription sent electronically to  a different pharmacy, notify our office through [facility] or by phone at [phone] option 4.     Si Usted Necesita Algo Despus de Su Visita  Tambin puede enviarnos un mensaje a travs de Pharmacist, community. Por lo general respondemos a los mensajes de MyChart en el transcurso de 1 a 2 das hbiles.  Para renovar recetas, por favor pida a su farmacia que se ponga en contacto con nuestra oficina. Harland Dingwall de fax es [place] [phone].  Si tiene un asunto urgente cuando la clnica est cerrada y que no puede esperar hasta el siguiente da hbil, puede llamar/localizar a su doctor(a) al nmero que aparece a continuacin.   Por favor, tenga en cuenta que aunque hacemos todo lo posible para estar disponibles para asuntos urgentes fuera del horario de [place], no estamos disponibles las 24 horas del da, los 7 das de la [place].   Si tiene un problema urgente y no puede comunicarse con nosotros, puede  optar por buscar atencin mdica  en el consultorio de su doctor(a), en una clnica privada, en un centro de atencin urgente o en una sala de emergencias.  Si tiene Engineering geologist, por favor llame inmediatamente al 911 o vaya a la sala de emergencias.  Nmeros de bper  - Dr. Nehemiah Massed: [phone]  - Dra. Moye: [phone]  - Dra. Nicole Kindred: [phone]  En caso de inclemencias del [place], por favor llame a Johnsie Kindred principal al [phone] para una actualizacin sobre el [place] de cualquier retraso o cierre.  Consejos para la medicacin en dermatologa: Por favor, guarde las cajas en las que vienen los medicamentos de uso tpico para ayudarle a seguir las instrucciones sobre dnde y cmo usarlos. Las farmacias generalmente imprimen las instrucciones del medicamento slo en las cajas y no directamente en los tubos del [place].   Si su medicamento es muy caro, por favor, pngase en contacto con Zigmund Daniel llamando al [phone] y presione la opcin 4 o envenos un mensaje a travs de Pharmacist, community.   No podemos decirle cul ser su copago por los medicamentos por adelantado ya que esto es diferente dependiendo de la cobertura de su seguro. Sin embargo, es posible que podamos encontrar un medicamento sustituto a Electrical engineer un formulario para que el seguro cubra el medicamento que se considera necesario.   Si se requiere una autorizacin previa para que su compaa de seguros [place] su medicamento, por favor permtanos de 1 a 2 das hbiles para completar este proceso.  Los precios de los medicamentos varan con frecuencia dependiendo del Environmental consultant de dnde se surte la receta y alguna farmacias pueden ofrecer precios ms baratos.  El sitio web www.goodrx.com tiene cupones para medicamentos de Airline pilot. Los precios aqu no tienen en cuenta lo que podra costar con la ayuda del seguro (puede ser ms barato con su seguro), pero el sitio web puede darle el  precio si no utiliz Research scientist (physical sciences).  - Puede imprimir el cupn correspondiente y llevarlo con su receta a la farmacia.  - Tambin puede pasar por nuestra oficina durante el horario de atencin regular y Charity fundraiser una tarjeta de cupones de GoodRx.  - Si necesita que su receta se enve electrnicamente a una farmacia diferente, informe a nuestra oficina a travs de MyChart de [place] o por telfono llamando al [phone] y presione la opcin 4.

## 2021-07-19 ENCOUNTER — Other Ambulatory Visit: Payer: Self-pay | Admitting: Internal Medicine

## 2021-07-19 DIAGNOSIS — Z1231 Encounter for screening mammogram for malignant neoplasm of breast: Secondary | ICD-10-CM

## 2021-07-21 ENCOUNTER — Ambulatory Visit
Admission: RE | Admit: 2021-07-21 | Discharge: 2021-07-21 | Disposition: A | Discharge status: Home / Self Care | Payer: Medicare Other | Source: Ambulatory Visit | Attending: Internal Medicine | Admitting: Internal Medicine

## 2021-07-21 DIAGNOSIS — Z1231 Encounter for screening mammogram for malignant neoplasm of breast: Secondary | ICD-10-CM | POA: Insufficient documentation

## 2022-03-15 ENCOUNTER — Ambulatory Visit: Payer: Medicare HMO | Admitting: Dermatology

## 2022-03-15 VITALS — BP 136/83 | HR 85

## 2022-03-15 DIAGNOSIS — L309 Dermatitis, unspecified: Secondary | ICD-10-CM

## 2022-03-15 DIAGNOSIS — L578 Other skin changes due to chronic exposure to nonionizing radiation: Secondary | ICD-10-CM

## 2022-03-15 DIAGNOSIS — Z86018 Personal history of other benign neoplasm: Secondary | ICD-10-CM

## 2022-03-15 DIAGNOSIS — L308 Other specified dermatitis: Secondary | ICD-10-CM | POA: Diagnosis not present

## 2022-03-15 DIAGNOSIS — L814 Other melanin hyperpigmentation: Secondary | ICD-10-CM

## 2022-03-15 DIAGNOSIS — L821 Other seborrheic keratosis: Secondary | ICD-10-CM

## 2022-03-15 DIAGNOSIS — L820 Inflamed seborrheic keratosis: Secondary | ICD-10-CM

## 2022-03-15 DIAGNOSIS — Z1283 Encounter for screening for malignant neoplasm of skin: Secondary | ICD-10-CM

## 2022-03-15 DIAGNOSIS — L300 Nummular dermatitis: Secondary | ICD-10-CM | POA: Diagnosis not present

## 2022-03-15 DIAGNOSIS — D229 Melanocytic nevi, unspecified: Secondary | ICD-10-CM

## 2022-03-15 MED ORDER — MOMETASONE FUROATE 0.1 % EX CREA: TOPICAL_CREAM | CUTANEOUS | 2 refills | Status: DC

## 2022-03-15 NOTE — Patient Instructions (Addendum)
 Cryotherapy Aftercare  Wash gently with soap and water everyday.   Apply Vaseline and Band-Aid daily until healed.   Seborrheic Keratosis  What causes seborrheic keratoses? Seborrheic keratoses are harmless, common skin growths that first appear during adult life.  As time goes by, more growths appear.  Some people may develop a large number of them.  Seborrheic keratoses appear on both covered and uncovered body parts.  They are not caused by sunlight.  The tendency to develop seborrheic keratoses can be inherited.  They vary in color from skin-colored to gray, brown, or even black.  They can be either smooth or have a rough, warty surface.   Seborrheic keratoses are superficial and look as if they were stuck on the skin.  Under the microscope this type of keratosis looks like layers upon layers of skin.  That is why at times the top layer may seem to fall off, but the rest of the growth remains and re-grows.    Treatment Seborrheic keratoses do not need to be treated, but can easily be removed in the office.  Seborrheic keratoses often cause symptoms when they rub on clothing or jewelry.  Lesions can be in the way of shaving.  If they become inflamed, they can cause itching, soreness, or burning.  Removal of a seborrheic keratosis can be accomplished by freezing, burning, or surgery. If any spot bleeds, scabs, or grows rapidly, please return to have it checked, as these can be an indication of a skin cancer.        Due to recent changes in healthcare laws, you may see results of your pathology and/or laboratory studies on MyChart before the doctors have had a chance to review them. We understand that in some cases there may be results that are confusing or concerning to you. Please understand that not all results are received at the same time and often the doctors may need to interpret multiple results in order to provide you with the best plan of care or course of treatment. Therefore, we  ask that you please give us 2 business days to thoroughly review all your results before contacting the office for clarification. Should we see a critical lab result, you will be contacted sooner.   If You Need Anything After Your Visit  If you have any questions or concerns for your doctor, please call our main line at [phone] and press option 4 to reach your doctor's medical assistant. If no one answers, please leave a voicemail as directed and we will return your call as soon as possible. Messages left after 4 pm will be answered the following business day.   You may also send us a message via MyChart. We typically respond to MyChart messages within 1-2 business days.  For prescription refills, please ask your pharmacy to contact our office. Our fax number is [phone].  If you have an urgent issue when the clinic is closed that cannot wait until the next business day, you can page your doctor at the number below.    Please note that while we do our best to be available for urgent issues outside of office hours, we are not available 24/7.   If you have an urgent issue and are unable to reach us, you may choose to seek medical care at your doctor's office, retail clinic, urgent care center, or emergency room.  If you have a medical emergency, please immediately call 911 or go to the emergency department.  Pager Numbers  -   [phone]  - Dr. Laurence Ferrari: [phone]  - Dr. Nicole Kindred: [phone]  In the event of inclement weather, please call our main line at [phone] for an update on the status of any delays or closures.  Dermatology Medication Tips: Please keep the boxes that topical medications come in in order to help keep track of the instructions about where and how to use these. Pharmacies typically print the medication instructions only on the boxes and not directly on the medication tubes.   If your medication is too expensive, please contact our office at  [phone] option 4 or send [place] a message through [place].   We are unable to tell what your co-pay for medications will be in advance as this is different depending on your insurance coverage. However, we may be able to find a substitute medication at lower cost or fill out paperwork to get insurance to cover a needed medication.   If a prior authorization is required to get your medication covered by your insurance company, please allow [place] 1-2 business days to complete this process.  Drug prices often vary depending on where the prescription is filled and some pharmacies may offer cheaper prices.  The website www.goodrx.com contains coupons for medications through different pharmacies. The prices here do not account for what the cost may be with help from insurance (it may be cheaper with your insurance), but the website can give you the price if you did not use any insurance.  - You can print the associated coupon and take it with your prescription to the pharmacy.  - You may also stop by our office during regular business hours and pick up a GoodRx coupon card.  - If you need your prescription sent electronically to a different pharmacy, notify our office through [facility] or by phone at [phone] option 4.     Si Usted Necesita Algo Despus de Su Visita  Tambin puede enviarnos un mensaje a travs de Pharmacist, community. Por lo general respondemos a los mensajes de MyChart en el transcurso de 1 a 2 das hbiles.  Para renovar recetas, por favor pida a su farmacia que se ponga en contacto con nuestra oficina. Harland Dingwall de fax es [place] [phone].  Si tiene un asunto urgente cuando la clnica est cerrada y que no puede esperar hasta el siguiente da hbil, puede llamar/localizar a su doctor(a) al nmero que aparece a continuacin.   Por favor, tenga en cuenta que aunque hacemos todo lo posible para estar disponibles para asuntos urgentes fuera del horario de [place], no estamos  disponibles las 24 horas del da, los 7 das de la [place].   Si tiene un problema urgente y no puede comunicarse con nosotros, puede optar por buscar atencin mdica  en el consultorio de su doctor(a), en una clnica privada, en un centro de atencin urgente o en una sala de emergencias.  Si tiene Engineering geologist, por favor llame inmediatamente al 911 o vaya a la sala de emergencias.  Nmeros de bper  - Dr. Nehemiah Massed: [phone]  - Dra. Moye: [phone]  - Dra. Nicole Kindred: [phone]  En caso de inclemencias del [place], por favor llame a Johnsie Kindred principal al [phone] para una actualizacin sobre el [place] de cualquier retraso o cierre.  Consejos para la medicacin en dermatologa: Por favor, guarde las cajas en las que vienen los medicamentos de uso tpico para ayudarle a seguir las instrucciones sobre dnde y cmo usarlos. Las farmacias generalmente imprimen las instrucciones del medicamento slo en  no directamente en los tubos del medicamento.   Si su medicamento es muy caro, por favor, pngase en contacto con nuestra oficina llamando al [phone] y presione la opcin 4 o envenos un mensaje a travs de MyChart.   No podemos decirle cul ser su copago por los medicamentos por adelantado ya que esto es diferente dependiendo de la cobertura de su seguro. Sin embargo, es posible que podamos encontrar un medicamento sustituto a menor costo o llenar un formulario para que el seguro cubra el medicamento que se considera necesario.   Si se requiere una autorizacin previa para que su compaa de seguros cubra su medicamento, por favor permtanos de 1 a 2 das hbiles para completar este proceso.  Los precios de los medicamentos varan con frecuencia dependiendo del lugar de dnde se surte la receta y alguna farmacias pueden ofrecer precios ms baratos.  El sitio web www.goodrx.com tiene cupones para medicamentos de diferentes farmacias. Los precios aqu no  tienen en cuenta lo que podra costar con la ayuda del seguro (puede ser ms barato con su seguro), pero el sitio web puede darle el precio si no utiliz ningn seguro.  - Puede imprimir el cupn correspondiente y llevarlo con su receta a la farmacia.  - Tambin puede pasar por nuestra oficina durante el horario de atencin regular y recoger una tarjeta de cupones de GoodRx.  - Si necesita que su receta se enve electrnicamente a una farmacia diferente, informe a nuestra oficina a travs de MyChart de [place] o por telfono llamando al [phone] y presione la opcin 4.  

## 2022-03-15 NOTE — Progress Notes (Signed)
Follow-Up Visit   Subjective  Sarah Vaughn is a 77 y.o. female who presents for the following: Annual Exam.  The patient presents for Total-Body Skin Exam (TBSE) for skin cancer screening and mole check.  The patient has spots, moles and lesions to be evaluated, some may be new or changing. 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 She has a couple of bothersome spots on the left collarbone and back.   The following portions of the chart were reviewed this encounter and updated as appropriate:       Review of Systems:  No other skin or systemic complaints except as noted in HPI or Assessment and Plan.  Objective  Well appearing patient in no apparent distress; mood and affect are within normal limits.  A full examination was performed including scalp, head, eyes, ears, nose, lips, neck, chest, axillae, abdomen, back, buttocks, bilateral upper extremities, bilateral lower extremities, hands, feet, fingers, toes, fingernails, and toenails. All findings within normal limits unless otherwise noted below.  L clavicle x 4, L spinal upper back x 1 (5) Erythematous stuck-on, waxy papule  hands Clear today.  lower legs Pink scaly patch of the left calf and left foot dorsum; xerosis of the lower legs.    Assessment & Plan  Skin cancer screening performed today.  Actinic Damage - chronic, secondary to cumulative UV radiation exposure/sun exposure over time - diffuse scaly erythematous macules with underlying dyspigmentation - Recommend daily broad spectrum sunscreen SPF 30+ to sun-exposed areas, reapply every 2 hours as needed.  - Recommend staying in the shade or wearing long sleeves, sun glasses (UVA+UVB protection) and wide brim hats (4-inch brim around the entire circumference of the hat). - Call for new or changing lesions.  Lentigines - Scattered tan macules - Due to sun exposure - Benign-appearing, observe - Recommend daily broad spectrum  sunscreen SPF 30+ to sun-exposed areas, reapply every 2 hours as needed. - Call for any changes  Seborrheic Keratoses - Stuck-on, waxy, tan-brown papules and/or plaques  - Benign-appearing - Discussed benign etiology and prognosis. - Observe - Call for any changes  History of Inflamed Seborrheic Keratosis - left forearm Photos show erythema and crusting Resolved. Observe for recurrence.  Call clinic for new or changing lesions.   Recommend regular skin exams, daily broad-spectrum spf 30+ sunscreen use, and photoprotection.     Telangiectasia - Dilated blood vessels - Benign appearing on exam - Call for changes  Melanocytic Nevi - Tan-brown and/or pink-flesh-colored symmetric macules and papules - Benign appearing on exam today - Observation - Call clinic for new or changing moles - Recommend daily use of broad spectrum spf 30+ sunscreen to sun-exposed areas.   History of Lentigo Maligna (2002) Clear. Observe for recurrence of the left posterior lower leg Call clinic for new or changing lesions.   Recommend regular skin exams, daily broad-spectrum spf 30+ sunscreen use, and photoprotection.     Inflamed seborrheic keratosis (5) L clavicle x 4, L spinal upper back x 1  Symptomatic, irritating, patient would like treated.  Destruction of lesion - L clavicle x 4, L spinal upper back x 1  Destruction method: cryotherapy   Informed consent: discussed and consent obtained   Lesion destroyed using liquid nitrogen: Yes   Region frozen until ice ball extended beyond lesion: Yes   Outcome: patient tolerated procedure well with no complications   Post-procedure details: wound care instructions given   Additional details:  Prior  to procedure, discussed risks of blister formation, small wound, skin dyspigmentation, or rare scar following cryotherapy. Recommend Vaseline ointment to treated areas while healing.   Hand dermatitis hands  Chronic condition with duration or expected  duration over one year. Currently well-controlled.   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  While there is no cure, the rash and symptoms can be managed with topical prescription medications, and for more severe cases, with systemic medications.  Recommend mild soap and routine use of moisturizing cream after handwashing.  Minimize soap/water exposure when possible.   Continue mometasone cream qd/bid AAs prn flares dsp 45g 2Rf. Avoid face, groin, axilla.  Topical steroids (such as triamcinolone, fluocinolone, fluocinonide, mometasone, clobetasol, halobetasol, betamethasone, hydrocortisone) can cause thinning and lightening of the skin if they are used for too long in the same area. Your physician has selected the right strength medicine for your problem and area affected on the body. Please use your medication only as directed by your physician to prevent side effects.    Related Medications mometasone (ELOCON) 0.1 % cream Apply to affected areas hands, legs once to twice daily until rash improved. Avoid face, groin, underarms.  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 Condition is symptomatic/ bothersome to patient. Not currently at goal.   Recommend mild soap and moisturizing cream 1-2 times daily.  Gentle skin care handout provided.   Continue mometasone cream qd/bid Aas until itchy rash cleared and prn flares. Avoid face, groin, axilla. Topical steroids (such as triamcinolone, fluocinolone, fluocinonide, mometasone, clobetasol, halobetasol, betamethasone, hydrocortisone) can cause thinning and lightening of the skin if they are used for too long in the same area. Your physician has selected the right strength medicine for your problem and area affected on the body. Please use your medication only as directed by your physician to prevent side effects.     Return in  about 1 year (around 03/16/2023) for TBSE.  IJamesetta Orleans, CMA, am acting as scribe for Brendolyn Patty, MD .  Documentation: I have reviewed the above documentation for accuracy and completeness, and I agree with the above.  Brendolyn Patty MD

## 2022-09-28 ENCOUNTER — Other Ambulatory Visit: Payer: Self-pay | Admitting: Internal Medicine

## 2022-09-28 DIAGNOSIS — Z1231 Encounter for screening mammogram for malignant neoplasm of breast: Secondary | ICD-10-CM

## 2022-10-11 ENCOUNTER — Ambulatory Visit
Admission: RE | Admit: 2022-10-11 | Discharge: 2022-10-11 | Disposition: A | Discharge status: Home / Self Care | Payer: Medicare HMO | Source: Ambulatory Visit | Attending: Internal Medicine | Admitting: Internal Medicine

## 2022-10-11 DIAGNOSIS — Z1231 Encounter for screening mammogram for malignant neoplasm of breast: Secondary | ICD-10-CM | POA: Diagnosis present

## 2023-02-21 DIAGNOSIS — E1169 Type 2 diabetes mellitus with other specified complication: Secondary | ICD-10-CM | POA: Diagnosis not present

## 2023-02-21 DIAGNOSIS — E782 Mixed hyperlipidemia: Secondary | ICD-10-CM | POA: Diagnosis not present

## 2023-02-27 DIAGNOSIS — N1832 Chronic kidney disease, stage 3b: Secondary | ICD-10-CM | POA: Diagnosis not present

## 2023-02-27 DIAGNOSIS — Z0000 Encounter for general adult medical examination without abnormal findings: Secondary | ICD-10-CM | POA: Diagnosis not present

## 2023-02-27 DIAGNOSIS — N1831 Chronic kidney disease, stage 3a: Secondary | ICD-10-CM | POA: Diagnosis not present

## 2023-02-27 DIAGNOSIS — E1169 Type 2 diabetes mellitus with other specified complication: Secondary | ICD-10-CM | POA: Diagnosis not present

## 2023-02-27 DIAGNOSIS — E782 Mixed hyperlipidemia: Secondary | ICD-10-CM | POA: Diagnosis not present

## 2023-03-20 ENCOUNTER — Ambulatory Visit (INDEPENDENT_AMBULATORY_CARE_PROVIDER_SITE_OTHER): Payer: HMO | Admitting: Dermatology

## 2023-03-20 ENCOUNTER — Encounter: Payer: Self-pay | Admitting: Dermatology

## 2023-03-20 DIAGNOSIS — W908XXA Exposure to other nonionizing radiation, initial encounter: Secondary | ICD-10-CM

## 2023-03-20 DIAGNOSIS — L820 Inflamed seborrheic keratosis: Secondary | ICD-10-CM | POA: Diagnosis not present

## 2023-03-20 DIAGNOSIS — L821 Other seborrheic keratosis: Secondary | ICD-10-CM

## 2023-03-20 DIAGNOSIS — Z86006 Personal history of melanoma in-situ: Secondary | ICD-10-CM

## 2023-03-20 DIAGNOSIS — D1801 Hemangioma of skin and subcutaneous tissue: Secondary | ICD-10-CM | POA: Diagnosis not present

## 2023-03-20 DIAGNOSIS — L309 Dermatitis, unspecified: Secondary | ICD-10-CM | POA: Diagnosis not present

## 2023-03-20 DIAGNOSIS — L300 Nummular dermatitis: Secondary | ICD-10-CM

## 2023-03-20 DIAGNOSIS — L578 Other skin changes due to chronic exposure to nonionizing radiation: Secondary | ICD-10-CM

## 2023-03-20 DIAGNOSIS — Z1283 Encounter for screening for malignant neoplasm of skin: Secondary | ICD-10-CM | POA: Diagnosis not present

## 2023-03-20 DIAGNOSIS — L814 Other melanin hyperpigmentation: Secondary | ICD-10-CM

## 2023-03-20 DIAGNOSIS — D229 Melanocytic nevi, unspecified: Secondary | ICD-10-CM

## 2023-03-20 MED ORDER — MOMETASONE FUROATE 0.1 % EX CREA: TOPICAL_CREAM | CUTANEOUS | 2 refills | Status: AC

## 2023-03-20 NOTE — Progress Notes (Signed)
Follow-Up Visit   Subjective  Sarah Vaughn is a 78 y.o. female who presents for the following: Skin Cancer Screening and Full Body Skin Exam, hx of Lentigo Maligna, Hand derm, hands, Mometasone cr prn flares  The patient presents for Total-Body Skin Exam (TBSE) for skin cancer screening and mole check. The patient has spots, moles and lesions to be evaluated, some may be new or changing and the patient may have concern these could be cancer. She has itchy irritated spots on back by bra and around neck.    The following portions of the chart were reviewed this encounter and updated as appropriate: medications, allergies, medical history  Review of Systems:  No other skin or systemic complaints except as noted in HPI or Assessment and Plan.  Objective  Well appearing patient in no apparent distress; mood and affect are within normal limits.  A full examination was performed including scalp, head, eyes, ears, nose, lips, neck, chest, axillae, abdomen, back, buttocks, bilateral upper extremities, bilateral lower extremities, hands, feet, fingers, toes, fingernails, and toenails. All findings within normal limits unless otherwise noted below.   Relevant physical exam findings are noted in the Assessment and Plan.  R mid back braline x 3, L occipital hairline x 1, neck/chest x 17 (21) Stuck on waxy paps with erythema  Assessment & Plan   SKIN CANCER SCREENING PERFORMED TODAY.  ACTINIC DAMAGE face - Chronic condition, secondary to cumulative UV/sun exposure - diffuse scaly erythematous macules with underlying dyspigmentation - Recommend daily broad spectrum sunscreen SPF 30+ to sun-exposed areas, reapply every 2 hours as needed.  - Staying in the shade or wearing long sleeves, sun glasses (UVA+UVB protection) and wide brim hats (4-inch brim around the entire circumference of the hat) are also recommended for sun protection.  - Call for new or changing lesions.  LENTIGINES, SEBORRHEIC  KERATOSES, HEMANGIOMAS - Benign normal skin lesions - Benign-appearing - Call for any changes  MELANOCYTIC NEVI - Tan-brown and/or pink-flesh-colored symmetric macules and papules - Benign appearing on exam today - Observation - Call clinic for new or changing moles - Recommend daily use of broad spectrum spf 30+ sunscreen to sun-exposed areas.   HISTORY OF LENTIGO MALIGNA/MELANOMA IS  - No evidence of recurrence today- L post lower leg, treated 2002 - Recommend regular full body skin exams - Recommend daily broad spectrum sunscreen SPF 30+ to sun-exposed areas, reapply every 2 hours as needed.  - Call if any new or changing lesions are noted between office visits    HAND DERMATITIS hands Exam Scaly pink patches +/- fissures L hand dorsum, L and R fifth finger, R hand dorsum  Chronic and persistent condition with duration or expected duration over one year. Condition is symptomatic/ bothersome to patient. Not currently at goal.   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  While there is no cure, the rash and symptoms can be managed with topical prescription medications, and for more severe cases, with systemic medications.  Recommend mild soap and routine use of moisturizing cream after handwashing.  Minimize soap/water exposure when possible.    Treatment Plan Cont Mometasone cr qd/bid for up to 2 weeks prn flares, avoid f/g/a  Cont Cerave cream after hand washing Cont mild soap like Dove  Topical steroids (such as triamcinolone, fluocinolone, fluocinonide, mometasone, clobetasol, halobetasol, betamethasone, hydrocortisone) can cause thinning and lightening of the skin if they are used for too long in  the same area. Your physician has selected the right strength medicine for your problem and area affected on the body. Please use your medication only as directed by your physician to prevent side effects.    Nummular  Dermatitis legs Exam: Scaly pink papules coalescing to plaques L ant ankle, L post ankle  Chronic and persistent condition with duration or expected duration over one year. Condition is symptomatic/ bothersome to patient. Not currently at goal.   Nummular dermatitis (eczema) is a chronic, relapsing, pruritic condition that can significantly affect quality of life. It is often associated with dry skin can require treatment with prescription topical medications, in addition to dry skin care.  If there is underlying atopic dermatitis and topicals are not working, then biologic injections may be necessary to control symptoms.   Treatment Plan: Cont Mometasone cr qd/bid for up to 2 weeks prn flares, avoid f/g/a Recommend mild soap and moisturizing cream 1-2 times daily.  Gentle skin care handout provided.    INFLAMED SEBORRHEIC KERATOSIS (21) R mid back braline x 3, L occipital hairline x 1, neck/chest x 17 (21) Symptomatic, irritating, patient would like treated. Destruction of lesion - R mid back braline x 3, L occipital hairline x 1, neck/chest x 17 (21)  Destruction method: cryotherapy   Informed consent: discussed and consent obtained   Lesion destroyed using liquid nitrogen: Yes   Region frozen until ice ball extended beyond lesion: Yes   Outcome: patient tolerated procedure well with no complications   Post-procedure details: wound care instructions given   Additional details:  Prior to procedure, discussed risks of blister formation, small wound, skin dyspigmentation, or rare scar following cryotherapy. Recommend Vaseline ointment to treated areas while healing.  HAND DERMATITIS   Related Medications mometasone (ELOCON) 0.1 % cream Apply to affected areas hands, legs once to twice daily until rash improved. Avoid face, groin, underarms. Return in about 1 year (around 03/19/2024) for TBSE hx of Lentigo Maligna.  I, Ardis Rowan, RMA, am acting as scribe for Willeen Niece, MD  .   Documentation: I have reviewed the above documentation for accuracy and completeness, and I agree with the above.  Willeen Niece, MD

## 2023-03-20 NOTE — Patient Instructions (Addendum)
 Cryotherapy Aftercare  Wash gently with soap and water everyday.   Apply Vaseline and Band-Aid daily until healed.    Due to recent changes in healthcare laws, you may see results of your pathology and/or laboratory studies on MyChart before the doctors have had a chance to review them. We understand that in some cases there may be results that are confusing or concerning to you. Please understand that not all results are received at the same time and often the doctors may need to interpret multiple results in order to provide you with the best plan of care or course of treatment. Therefore, we ask that you please give [place] 2 business days to thoroughly review all your results before contacting the office for clarification. Should we see a critical lab result, you will be contacted sooner.   If You Need Anything After Your Visit  If you have any questions or concerns for your doctor, please call our main line at [phone] and press option 4 to reach your doctor's medical assistant. If no one answers, please leave a voicemail as directed and we will return your call as soon as possible. Messages left after 4 pm will be answered the following business day.   You may also send [place] a message via MyChart. We typically respond to MyChart messages within 1-2 business days.  For prescription refills, please ask your pharmacy to contact our office. Our fax number is [phone].  If you have an urgent issue when the clinic is closed that cannot wait until the next business day, you can page your doctor at the number below.    Please note that while we do our best to be available for urgent issues outside of office hours, we are not available 24/7.   If you have an urgent issue and are unable to reach [place], you may choose to seek medical care at your doctor's office, retail clinic, urgent care center, or emergency room.  If you have a medical emergency, please immediately call 911 or go to the emergency  department.  Pager Numbers  - Dr. Gwen Pounds: [phone]  - Dr. Roseanne Reno: [phone]  - Dr. Katrinka Blazing: [phone]   In the event of inclement weather, please call our main line at [phone] for an update on the status of any delays or closures.  Dermatology Medication Tips: Please keep the boxes that topical medications come in in order to help keep track of the instructions about where and how to use these. Pharmacies typically print the medication instructions only on the boxes and not directly on the medication tubes.   If your medication is too expensive, please contact our office at [phone] option 4 or send [place] a message through MyChart.   We are unable to tell what your co-pay for medications will be in advance as this is different depending on your insurance coverage. However, we may be able to find a substitute medication at lower cost or fill out paperwork to get insurance to cover a needed medication.   If a prior authorization is required to get your medication covered by your insurance company, please allow [place] 1-2 business days to complete this process.  Drug prices often vary depending on where the prescription is filled and some pharmacies may offer cheaper prices.  The website www.goodrx.com contains coupons for medications through different pharmacies. The prices here do not account for what the cost may be with help from insurance (it may be cheaper with your insurance), but the website can  give you the price if you did not use any insurance.  - You can print the associated coupon and take it with your prescription to the pharmacy.  - You may also stop by our office during regular business hours and pick up a GoodRx coupon card.  - If you need your prescription sent electronically to a different pharmacy, notify our office through [facility] or by phone at [phone] option 4.     Si Usted Necesita Algo Despus de Su Visita  Tambin puede enviarnos  un mensaje a travs de Clinical cytogeneticist. Por lo general respondemos a los mensajes de MyChart en el transcurso de 1 a 2 das hbiles.  Para renovar recetas, por favor pida a su farmacia que se ponga en contacto con nuestra oficina. Annie Sable de fax es [place] [phone].  Si tiene un asunto urgente cuando la clnica est cerrada y que no puede esperar hasta el siguiente da hbil, puede llamar/localizar a su doctor(a) al nmero que aparece a continuacin.   Por favor, tenga en cuenta que aunque hacemos todo lo posible para estar disponibles para asuntos urgentes fuera del horario de [facility], no estamos disponibles las 24 horas del da, los 7 [address]  [address] de la [place].   Si tiene un problema urgente y no puede comunicarse con nosotros, puede optar por buscar atencin mdica  en el consultorio de su doctor(a), en una clnica privada, en un centro de atencin urgente o en una sala de emergencias.  Si tiene Engineer, drilling, por favor llame inmediatamente al 911 o vaya a la sala de emergencias.  Nmeros de bper  - Dr. Gwen Pounds: [phone]  - Dra. Roseanne Reno: [phone]  - Dr. Katrinka Blazing: [phone]   En caso de inclemencias del tiempo, por favor llame a Lacy Duverney principal al [phone] para una actualizacin sobre el [place] de cualquier retraso o cierre.  Consejos para la medicacin en dermatologa: Por favor, guarde las cajas en las que vienen los medicamentos de uso tpico para ayudarle a seguir las instrucciones sobre dnde y cmo usarlos. Las farmacias generalmente imprimen las instrucciones del medicamento slo en las cajas y no directamente en los tubos del [place].   Si su medicamento es muy caro, por favor, pngase en contacto con Rolm Gala llamando al [phone] y presione la opcin 4 o envenos un mensaje a travs de Clinical cytogeneticist.   No podemos decirle cul ser su copago por los medicamentos por adelantado ya que esto es diferente dependiendo de la cobertura de su seguro. Sin  embargo, es posible que podamos encontrar un medicamento sustituto a Audiological scientist un formulario para que el seguro cubra el medicamento que se considera necesario.   Si se requiere una autorizacin previa para que su compaa de seguros [place] su medicamento, por favor permtanos de 1 a 2 das hbiles para completar [address]  Los precios de los medicamentos varan con frecuencia dependiendo del Environmental consultant de dnde se surte la receta y alguna farmacias pueden ofrecer precios ms baratos.  El sitio web www.goodrx.com tiene cupones para medicamentos de Health and safety inspector. Los precios aqu no tienen en cuenta lo que podra costar con la ayuda del seguro (puede ser ms barato con su seguro), pero el sitio web puede darle el precio si no utiliz Tourist information centre manager.  - Puede imprimir el cupn correspondiente y llevarlo con su receta a la farmacia.  - Tambin puede pasar por nuestra oficina durante el horario de atencin regular y Education officer, museum una tarjeta de cupones de GoodRx.  -  Si necesita que su receta se enve electrnicamente a Psychiatrist, informe a nuestra oficina a travs de MyChart de [place] o por telfono llamando al [phone] y presione la opcin 4.

## 2023-08-03 DIAGNOSIS — H2513 Age-related nuclear cataract, bilateral: Secondary | ICD-10-CM | POA: Diagnosis not present

## 2023-08-03 DIAGNOSIS — H43813 Vitreous degeneration, bilateral: Secondary | ICD-10-CM | POA: Diagnosis not present

## 2023-08-03 DIAGNOSIS — E119 Type 2 diabetes mellitus without complications: Secondary | ICD-10-CM | POA: Diagnosis not present

## 2023-08-29 DIAGNOSIS — E782 Mixed hyperlipidemia: Secondary | ICD-10-CM | POA: Diagnosis not present

## 2023-08-29 DIAGNOSIS — E1169 Type 2 diabetes mellitus with other specified complication: Secondary | ICD-10-CM | POA: Diagnosis not present

## 2023-08-29 DIAGNOSIS — E538 Deficiency of other specified B group vitamins: Secondary | ICD-10-CM | POA: Diagnosis not present

## 2023-09-06 DIAGNOSIS — E1169 Type 2 diabetes mellitus with other specified complication: Secondary | ICD-10-CM | POA: Diagnosis not present

## 2023-09-06 DIAGNOSIS — E782 Mixed hyperlipidemia: Secondary | ICD-10-CM | POA: Diagnosis not present

## 2023-09-06 DIAGNOSIS — Z79899 Other long term (current) drug therapy: Secondary | ICD-10-CM | POA: Diagnosis not present

## 2023-09-06 DIAGNOSIS — N1831 Chronic kidney disease, stage 3a: Secondary | ICD-10-CM | POA: Diagnosis not present

## 2024-02-22 ENCOUNTER — Other Ambulatory Visit: Payer: Self-pay | Admitting: Internal Medicine

## 2024-02-22 DIAGNOSIS — Z1231 Encounter for screening mammogram for malignant neoplasm of breast: Secondary | ICD-10-CM

## 2024-03-15 ENCOUNTER — Ambulatory Visit
Admission: RE | Admit: 2024-03-15 | Discharge: 2024-03-15 | Disposition: A | Source: Ambulatory Visit | Attending: Internal Medicine | Admitting: Internal Medicine

## 2024-03-15 DIAGNOSIS — Z1231 Encounter for screening mammogram for malignant neoplasm of breast: Secondary | ICD-10-CM | POA: Insufficient documentation

## 2024-03-19 ENCOUNTER — Ambulatory Visit: Payer: 59 | Admitting: Dermatology

## 2024-04-24 ENCOUNTER — Ambulatory Visit: Admitting: Dermatology
